# Patient Record
Sex: Male | Born: 1990 | Race: White | Hispanic: No | Marital: Single | Smoking: Current every day smoker
Health system: Southern US, Community
[De-identification: ages and names within clinical notes are randomized; demographics above are authoritative.]

## PROBLEM LIST (undated history)

## (undated) DIAGNOSIS — S62609A Fracture of unspecified phalanx of unspecified finger, initial encounter for closed fracture: Secondary | ICD-10-CM

## (undated) HISTORY — PX: APPENDECTOMY: SHX54

---
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
## 2011-08-04 ENCOUNTER — Emergency Department: Payer: Self-pay | Admitting: Emergency Medicine

## 2013-11-20 ENCOUNTER — Encounter (HOSPITAL_COMMUNITY): Payer: Self-pay | Admitting: Emergency Medicine

## 2013-11-20 ENCOUNTER — Emergency Department (HOSPITAL_COMMUNITY)
Admission: EM | Admit: 2013-11-20 | Discharge: 2013-11-20 | Disposition: A | Discharge status: Home / Self Care | Payer: Self-pay | Attending: Emergency Medicine | Admitting: Emergency Medicine

## 2013-11-20 DIAGNOSIS — L509 Urticaria, unspecified: Secondary | ICD-10-CM | POA: Insufficient documentation

## 2013-11-20 DIAGNOSIS — F17200 Nicotine dependence, unspecified, uncomplicated: Secondary | ICD-10-CM | POA: Insufficient documentation

## 2013-11-20 MED ORDER — PREDNISONE 50 MG PO TABS
60.0000 mg | ORAL_TABLET | Freq: Once | ORAL | Status: AC
  Administered 2013-11-20: 22:00:00 60 mg via ORAL
  Filled 2013-11-20 (×2): qty 1

## 2013-11-20 MED ORDER — FAMOTIDINE 20 MG PO TABS
20.0000 mg | ORAL_TABLET | Freq: Once | ORAL | Status: AC
  Administered 2013-11-20: 20 mg via ORAL
  Filled 2013-11-20: qty 1

## 2013-11-20 MED ORDER — PREDNISONE 10 MG PO TABS: ORAL_TABLET | ORAL | Status: DC

## 2013-11-20 MED ORDER — FAMOTIDINE 20 MG PO TABS: 20.0000 mg | ORAL_TABLET | Freq: Two times a day (BID) | ORAL | Status: DC

## 2013-11-20 NOTE — ED Notes (Signed)
Since Nov. 23rd pt with break out in rash/hives every night, takes benadryl which helps, took allergy relief tablet tonight

## 2013-11-20 NOTE — ED Notes (Signed)
Pt seen by EDPa for initial assessment. 

## 2013-11-22 NOTE — ED Provider Notes (Signed)
CSN: [id number]     Arrival date & time 11/20/13  2039 History   First MD Initiated Contact with Patient 11/20/13 2109     Chief Complaint  Patient presents with  . Rash   (Consider location/radiation/quality/duration/timing/severity/associated sxs/prior Treatment) Patient is a 22 y.o. male presenting with rash. The history is provided by the patient.  Rash Location:  Torso Torso rash location:  L chest, R chest and upper back Quality: itchiness and redness   Quality: not blistering, not bruising, not dry, not painful, not peeling, not scaling, not swelling and not weeping   Severity:  Mild Onset quality:  Gradual Duration:  1 month Timing:  Intermittent Progression:  Unchanged Chronicity:  Recurrent Context comment:  Began when hunting season started, has been using a "scent remover" on his clothing Relieved by:  Antihistamines Worsened by:  Nothing tried Ineffective treatments:  Antihistamines Associated symptoms: no abdominal pain, no diarrhea, no fatigue, no fever, no headaches, no hoarse voice, no induration, no myalgias, no nausea, no periorbital edema, no shortness of breath, no sore throat, no throat swelling, no tongue swelling, not vomiting and not wheezing     History reviewed. No pertinent past medical history. History reviewed. No pertinent past surgical history. History reviewed. No pertinent family history. History  Substance Use Topics  . Smoking status: Current Every Day Smoker    Types: Cigarettes  . Smokeless tobacco: Not on file  . Alcohol Use: No    Review of Systems  Constitutional: Negative for fever, chills, activity change, appetite change and fatigue.  HENT: Negative for facial swelling, hoarse voice, sore throat and trouble swallowing.   Respiratory: Negative for chest tightness, shortness of breath and wheezing.   Gastrointestinal: Negative for nausea, vomiting, abdominal pain and diarrhea.  Musculoskeletal: Negative for myalgias, neck pain and  neck stiffness.  Skin: Positive for rash. Negative for wound.  Neurological: Negative for dizziness, weakness, numbness and headaches.  All other systems reviewed and are negative.    Allergies  Review of patient's allergies indicates no known allergies.  Home Medications   Current Outpatient Rx  Name  Route  Sig  Dispense  Refill  . famotidine (PEPCID) 20 MG tablet   Oral   Take 1 tablet (20 mg total) by mouth 2 (two) times daily. For 7 days   14 tablet   0   . predniSONE (DELTASONE) 10 MG tablet      Take 6 tablets day one, 5 tablets day two, 4 tablets day three, 3 tablets day four, 2 tablets day five, then 1 tablet day six   21 tablet   0    BP 117/61  Pulse 77  Temp(Src) 98.1 F (36.7 C) (Oral)  Resp 18  Ht 5\' 10"  (1.778 m)  Wt 140 lb (63.504 kg)  BMI 20.09 kg/m2  SpO2 96% Physical Exam  Nursing note and vitals reviewed. Constitutional: He is oriented to person, place, and time. He appears well-developed and well-nourished. No distress.  HENT:  Head: Normocephalic and atraumatic.  Mouth/Throat: Oropharynx is clear and moist.  Neck: Normal range of motion, full passive range of motion without pain and phonation normal. Neck supple.  Cardiovascular: Normal rate, regular rhythm, normal heart sounds and intact distal pulses.   No murmur heard. Pulmonary/Chest: Effort normal and breath sounds normal. No respiratory distress. He has no wheezes. He has no rales. He exhibits no tenderness.  Musculoskeletal: He exhibits no edema and no tenderness.  Lymphadenopathy:    He has no cervical  adenopathy.  Neurological: He is alert and oriented to person, place, and time. He exhibits normal muscle tone. Coordination normal.  Skin: Skin is warm. Rash noted. There is erythema.  Slightly maculopapular rash to the trunk.  No edema.  No pustules or vesicles    ED Course  Procedures (including critical care time) Labs Review Labs Reviewed - No data to display Imaging  Review No results found.  EKG Interpretation   None       MDM   1. Urticaria    Patient is well appearing.  No angioedema.  Scattered intermittent hives to the torso w/o edema.  Pt agrees to prednisone taper, Pepcid and to continue benadryl.  Appears stable for d/c    Denika Krone L. Trisha Mangle, PA-C 11/22/13 1215

## 2013-11-22 NOTE — ED Provider Notes (Signed)
Medical screening examination/treatment/procedure(s) were performed by non-physician practitioner and as supervising physician I was immediately available for consultation/collaboration.  EKG Interpretation   None         Markez L Tanaya Dunigan, MD 11/22/13 2317 

## 2015-04-15 ENCOUNTER — Emergency Department (HOSPITAL_COMMUNITY)
Admission: EM | Admit: 2015-04-15 | Discharge: 2015-04-15 | Disposition: A | Discharge status: Home / Self Care | Payer: Self-pay | Attending: Emergency Medicine | Admitting: Emergency Medicine

## 2015-04-15 ENCOUNTER — Encounter (HOSPITAL_COMMUNITY): Payer: Self-pay | Admitting: Cardiology

## 2015-04-15 DIAGNOSIS — Z720 Tobacco use: Secondary | ICD-10-CM | POA: Insufficient documentation

## 2015-04-15 DIAGNOSIS — L559 Sunburn, unspecified: Secondary | ICD-10-CM

## 2015-04-15 DIAGNOSIS — L551 Sunburn of second degree: Secondary | ICD-10-CM | POA: Insufficient documentation

## 2015-04-15 DIAGNOSIS — Z79899 Other long term (current) drug therapy: Secondary | ICD-10-CM | POA: Insufficient documentation

## 2015-04-15 MED ORDER — HYDROCODONE-ACETAMINOPHEN 5-325 MG PO TABS
1.0000 | ORAL_TABLET | Freq: Once | ORAL | Status: AC
  Administered 2015-04-15: 1 via ORAL
  Filled 2015-04-15: qty 1

## 2015-04-15 MED ORDER — IBUPROFEN 800 MG PO TABS: 800.0000 mg | ORAL_TABLET | Freq: Three times a day (TID) | ORAL | Status: DC

## 2015-04-15 MED ORDER — IBUPROFEN 800 MG PO TABS
800.0000 mg | ORAL_TABLET | Freq: Once | ORAL | Status: AC
  Administered 2015-04-15: 800 mg via ORAL
  Filled 2015-04-15: qty 1

## 2015-04-15 MED ORDER — SILVER SULFADIAZINE 1 % EX CREA: TOPICAL_CREAM | CUTANEOUS | Status: DC

## 2015-04-15 MED ORDER — HYDROCODONE-ACETAMINOPHEN 5-325 MG PO TABS: ORAL_TABLET | ORAL | Status: DC

## 2015-04-15 MED ORDER — SILVER SULFADIAZINE 1 % EX CREA
TOPICAL_CREAM | Freq: Once | CUTANEOUS | Status: AC
  Administered 2015-04-15: 21:00:00 via TOPICAL
  Filled 2015-04-15: qty 50

## 2015-04-15 NOTE — ED Notes (Signed)
Sunburn to bilateral feet.

## 2015-04-15 NOTE — Discharge Instructions (Signed)
Sunburn   Sunburn is skin damage from being out in the sun too long. If you have light or fair skin, you may get sunburned more easily. Getting sunburned over and over can cause wrinkles and dark spots on the skin (sun spots). It can also increase your chance of getting skin cancer.  HOME CARE  · Avoid being out in the sun until your sunburn is gone.  · Take a cool bath to help lessen pain. Put a cold, damp washcloth on the sunburn to help lessen pain. Do not put ice on the sunburn.  · Only take medicine as told by your doctor.  · Use sunburn creams or gels on your skin but not on blisters.  · Drink enough fluids to keep your pee (urine) clear or pale yellow.  · Do not break blisters. If blisters break, your doctor may tell you to use a medicated cream on the area.  To keep from getting sunburned:  · Avoid the sun between 10:00 a.m. and 4:00 p.m. during the day.  · Put sunscreen on 30 minutes before being in the sun.  · Wear a hat, clothing, and sunglasses to protect against the sun.  · Avoid medicines, herbs, and foods that make you more sensitive to sun.  · Avoid tanning beds.  GET HELP RIGHT AWAY IF:  · You have a fever.  · You have pain and medicine does not help.  · You throw up (vomit) or have watery poop (diarrhea).  · You feel like you will pass out (faint).  · You have a headache and feel confused.  · You have very bad blisters.  · You have yellowish-white fluid (pus) coming from your blisters.  · Your burn gets more painful and puffy (swollen).  MAKE SURE YOU:  · Understand these instructions.  · Will watch your condition.  · Will get help right away if you are not doing well or get worse.  Document Released: 08/05/2011 Document Revised: 03/20/2013 Document Reviewed: 08/05/2011  [employer]® Patient Information ©2015 [employer]. This information is not intended to replace advice given to you by your health care provider. Make sure you discuss any questions you have with your health care provider.

## 2015-04-15 NOTE — ED Notes (Signed)
Both feet red with blister on rt foot. [place] in  [place]
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
## 2018-12-07 DIAGNOSIS — S62609A Fracture of unspecified phalanx of unspecified finger, initial encounter for closed fracture: Secondary | ICD-10-CM

## 2018-12-07 HISTORY — DX: Fracture of unspecified phalanx of unspecified finger, initial encounter for closed fracture: S62.609A

## 2018-12-08 ENCOUNTER — Encounter (HOSPITAL_BASED_OUTPATIENT_CLINIC_OR_DEPARTMENT_OTHER): Payer: Self-pay | Admitting: *Deleted

## 2018-12-08 ENCOUNTER — Encounter (HOSPITAL_COMMUNITY): Payer: Self-pay | Admitting: Emergency Medicine

## 2018-12-08 ENCOUNTER — Other Ambulatory Visit: Payer: Self-pay | Admitting: Orthopedic Surgery

## 2018-12-08 ENCOUNTER — Other Ambulatory Visit: Payer: Self-pay

## 2018-12-08 ENCOUNTER — Emergency Department (HOSPITAL_COMMUNITY)
Admission: EM | Admit: 2018-12-08 | Discharge: 2018-12-08 | Disposition: A | Discharge status: Home / Self Care | Payer: Self-pay | Attending: Emergency Medicine | Admitting: Emergency Medicine

## 2018-12-08 ENCOUNTER — Emergency Department (HOSPITAL_COMMUNITY): Payer: Self-pay

## 2018-12-08 DIAGNOSIS — W010XXA Fall on same level from slipping, tripping and stumbling without subsequent striking against object, initial encounter: Secondary | ICD-10-CM | POA: Insufficient documentation

## 2018-12-08 DIAGNOSIS — F17210 Nicotine dependence, cigarettes, uncomplicated: Secondary | ICD-10-CM | POA: Insufficient documentation

## 2018-12-08 DIAGNOSIS — S62619A Displaced fracture of proximal phalanx of unspecified finger, initial encounter for closed fracture: Secondary | ICD-10-CM

## 2018-12-08 DIAGNOSIS — Y929 Unspecified place or not applicable: Secondary | ICD-10-CM | POA: Insufficient documentation

## 2018-12-08 DIAGNOSIS — S62617A Displaced fracture of proximal phalanx of left little finger, initial encounter for closed fracture: Secondary | ICD-10-CM | POA: Insufficient documentation

## 2018-12-08 DIAGNOSIS — Y9389 Activity, other specified: Secondary | ICD-10-CM | POA: Insufficient documentation

## 2018-12-08 DIAGNOSIS — Y999 Unspecified external cause status: Secondary | ICD-10-CM | POA: Insufficient documentation

## 2018-12-08 MED ORDER — HYDROCODONE-ACETAMINOPHEN 5-325 MG PO TABS
2.0000 | ORAL_TABLET | Freq: Once | ORAL | Status: AC
  Administered 2018-12-08: 2 via ORAL
  Filled 2018-12-08: qty 2

## 2018-12-08 MED ORDER — HYDROCODONE-ACETAMINOPHEN 5-325 MG PO TABS: 1.0000 | ORAL_TABLET | Freq: Four times a day (QID) | ORAL | 0 refills | Status: DC | PRN

## 2018-12-08 NOTE — ED Triage Notes (Signed)
Pt states he fell and tried to catch himself when he injured his left pinky finger.

## 2018-12-08 NOTE — Discharge Instructions (Addendum)
Wear ulnar gutter splint until followed up by orthopedics.  Hydrocodone is prescribed as needed for pain.  Ice for 20 minutes every 2 hours while awake for the next 2 days.  You are to follow-up with Dr. Janee Morn in the next few days.  He is aware of your condition and will have his office contact you to make these arrangements.

## 2018-12-08 NOTE — ED Provider Notes (Addendum)
[facility] EMERGENCY DEPARTMENT Provider Note   CSN: [id number] Arrival date & time: 12/08/18  0152     History   Chief Complaint Chief Complaint  Patient presents with  . Finger Injury    HPI Darrell Cannon is a 28 y.o. male.  Patient is a 28 year old male presenting with complaints of a left fifth finger injury.  He reports falling and trying to catch himself with his hand.  This happened earlier this evening.  He reports pain and swelling since.  Pain is worse with movement.  There are no alleviating factors.  The history is provided by the patient.    History reviewed. No pertinent past medical history.  There are no active problems to display for this patient.   Past Surgical History:  Procedure Laterality Date  . APPENDECTOMY          Home Medications    Prior to Admission medications   Medication Sig Start Date End Date Taking? Authorizing Provider  famotidine (PEPCID) 20 MG tablet Take 1 tablet (20 mg total) by mouth 2 (two) times daily. For 7 days 11/20/13   Pauline Aus, PA-C  HYDROcodone-acetaminophen (NORCO/VICODIN) 5-325 MG per tablet Take one-two tabs po q 4-6 hrs prn pain 04/15/15   Triplett, Tammy, PA-C  ibuprofen (ADVIL,MOTRIN) 800 MG tablet Take 1 tablet (800 mg total) by mouth 3 (three) times daily. 04/15/15   Triplett, Tammy, PA-C  predniSONE (DELTASONE) 10 MG tablet Take 6 tablets day one, 5 tablets day two, 4 tablets day three, 3 tablets day four, 2 tablets day five, then 1 tablet day six 11/20/13   Triplett, Tammy, PA-C  silver sulfADIAZINE (SILVADENE) 1 % cream Wash off and re-apply twice a day 04/15/15   Pauline Aus, PA-C    Family History No family history on file.  Social History Social History   Tobacco Use  . Smoking status: Current Every Day Smoker    Types: Cigarettes  . Smokeless tobacco: Never Used  Substance Use Topics  . Alcohol use: No  . Drug use: No     Allergies   Patient has no known allergies.   Review of  Systems Review of Systems  All other systems reviewed and are negative.    Physical Exam Updated Vital Signs BP 123/77 (BP Location: Left Arm)   Pulse 70   Temp 98.1 F (36.7 C) (Oral)   Resp 18   SpO2 97%   Physical Exam Vitals signs and nursing note reviewed.  Constitutional:      Appearance: Normal appearance.  Pulmonary:     Effort: Pulmonary effort is normal.  Musculoskeletal:     Comments: The left fifth finger is noted to be swollen and tender over the proximal phalanx.  There does appear to be some internal rotation of the finger.  Capillary refill is brisk and sensation is intact to the fingertip.  Skin:    General: Skin is warm and dry.  Neurological:     Mental Status: He is alert.      ED Treatments / Results  Labs (all labs ordered are listed, but only abnormal results are displayed) Labs Reviewed - No data to display  EKG None  Radiology Dg Finger Little Left  Result Date: 12/08/2018 CLINICAL DATA:  Fall with little finger injury EXAM: LEFT LITTLE FINGER 2+V COMPARISON:  None. FINDINGS: There is an oblique, extra-articular, mildly dorsally and laterally displaced fracture of the proximal phalanx of the left little finger. Moderate soft tissue swelling. IMPRESSION: Oblique,  extra-articular, mildly dorsally and laterally displaced fracture of the proximal phalanx of the left little finger. Electronically Signed   By: Deatra Robinson M.D.   On: 12/08/2018 02:23    Procedures Procedures (including critical care time)  Medications Ordered in ED Medications - No data to display   Initial Impression / Assessment and Plan / ED Course  I have reviewed the triage vital signs and the nursing notes.  Pertinent labs & imaging results that were available during my care of the patient were reviewed by me and considered in my medical decision making (see chart for details).  X-rays show a spiral/oblique fracture of the proximal phalanx of the left fifth finger.   This finding was discussed with Dr. Janee Morn from hand surgery.  He would like the patient to be placed in an ulnar gutter splint.  His office will call to make arrangements for follow-up in the upcoming 1 to 2 days.  Patient will also be prescribed medication for pain.  [facility] database was reviewed and the patient has no previous controlled substances filled.  Final Clinical Impressions(s) / ED Diagnoses   Final diagnoses:  None    ED Discharge Orders    None       Geoffery Lyons, MD 12/08/18 6767    Geoffery Lyons, MD 12/08/18 0320

## 2018-12-09 NOTE — H&P (Signed)
Darrell Cannon is an 28 y.o. male.   Chief Complaint: L SF injury HPI: this patient is a 28 year old male, who reports falling, sustaining an injury to the left small finger.  He was evaluated in emergency department at [facility].  He was noted to have slight digital malrotation, x-rays consistent with a displaced fracture of the proximal phalanx of the left small finger, and he was splinted.  Past Medical History:  Diagnosis Date  . Finger fracture, left 12/07/2018   small finger    Past Surgical History:  Procedure Laterality Date  . APPENDECTOMY      History reviewed. No pertinent family history. Social History:  reports that he has been smoking cigarettes. He has a 5.00 pack-year smoking history. He has never used smokeless tobacco. He reports current alcohol use. He reports that he does not use drugs.  Allergies: No Known Allergies  No medications prior to admission.    No results found for this or any previous visit (from the past 48 hour(s)). Dg Finger Little Left  Result Date: 12/08/2018 CLINICAL DATA:  Fall with little finger injury EXAM: LEFT LITTLE FINGER 2+V COMPARISON:  None. FINDINGS: There is an oblique, extra-articular, mildly dorsally and laterally displaced fracture of the proximal phalanx of the left little finger. Moderate soft tissue swelling. IMPRESSION: Oblique, extra-articular, mildly dorsally and laterally displaced fracture of the proximal phalanx of the left little finger. Electronically Signed   By: Deatra Robinson M.D.   On: 12/08/2018 02:23    Review of Systems  All other systems reviewed and are negative.   Height 5\' 4"  (1.626 m), weight 58.1 kg. Physical Exam  Constitutional: He is oriented to person, place, and time. He appears well-developed and well-nourished.  HENT:  Head: Normocephalic and atraumatic.  Eyes: EOM are normal.  Neck: Normal range of motion.  Cardiovascular: Intact distal pulses.  Respiratory: Effort normal.  GI: Soft.    Musculoskeletal:       Arms:  Neurological: He is alert and oriented to person, place, and time.  Skin: Skin is warm and dry.  Psychiatric: He has a normal mood and affect. His behavior is normal. Judgment and thought content normal.     Assessment/Plan I discussed these findings with him and options for treatment.  I confirmed her previous recommendation to proceed with operative intervention, likely with plate and screw fixation to obtain and maintain a more anatomic alignment, optimizing function and appropriate alignment of the digit, particular when it is closed.  The details of the operative procedure were discussed with the patient.  Questions were invited and answered.  In addition to the goal of the procedure, the risks of the procedure to include but not limited to bleeding; infection; damage to the nerves or blood vessels that could result in bleeding, numbness, weakness, chronic pain, and the need for additional procedures; stiffness; the need for revision surgery; and anesthetic risks were reviewed.  No specific outcome was guaranteed or implied.  Informed consent was obtained.  Jodi Marble, MD 12/09/2018, 2:12 PM

## 2018-12-12 ENCOUNTER — Ambulatory Visit (HOSPITAL_BASED_OUTPATIENT_CLINIC_OR_DEPARTMENT_OTHER)
Admission: RE | Admit: 2018-12-12 | Discharge: 2018-12-12 | Disposition: A | Discharge status: Home / Self Care | Payer: Self-pay | Attending: Orthopedic Surgery | Admitting: Orthopedic Surgery

## 2018-12-12 ENCOUNTER — Encounter (HOSPITAL_BASED_OUTPATIENT_CLINIC_OR_DEPARTMENT_OTHER)
Admission: RE | Disposition: A | Discharge status: Home / Self Care | Payer: Self-pay | Source: Home / Self Care | Attending: Orthopedic Surgery

## 2018-12-12 ENCOUNTER — Encounter (HOSPITAL_BASED_OUTPATIENT_CLINIC_OR_DEPARTMENT_OTHER): Payer: Self-pay | Admitting: Certified Registered"

## 2018-12-12 ENCOUNTER — Ambulatory Visit (HOSPITAL_COMMUNITY): Payer: Self-pay

## 2018-12-12 ENCOUNTER — Other Ambulatory Visit: Payer: Self-pay

## 2018-12-12 ENCOUNTER — Ambulatory Visit (HOSPITAL_BASED_OUTPATIENT_CLINIC_OR_DEPARTMENT_OTHER): Payer: Self-pay | Admitting: Certified Registered"

## 2018-12-12 DIAGNOSIS — S62617A Displaced fracture of proximal phalanx of left little finger, initial encounter for closed fracture: Secondary | ICD-10-CM | POA: Insufficient documentation

## 2018-12-12 DIAGNOSIS — W19XXXA Unspecified fall, initial encounter: Secondary | ICD-10-CM | POA: Insufficient documentation

## 2018-12-12 DIAGNOSIS — Z419 Encounter for procedure for purposes other than remedying health state, unspecified: Secondary | ICD-10-CM

## 2018-12-12 DIAGNOSIS — F17210 Nicotine dependence, cigarettes, uncomplicated: Secondary | ICD-10-CM | POA: Insufficient documentation

## 2018-12-12 HISTORY — PX: OPEN REDUCTION INTERNAL FIXATION (ORIF) METACARPAL: SHX6234

## 2018-12-12 HISTORY — DX: Fracture of unspecified phalanx of unspecified finger, initial encounter for closed fracture: S62.609A

## 2018-12-12 SURGERY — OPEN REDUCTION INTERNAL FIXATION (ORIF) METACARPAL
Anesthesia: General | Site: Hand | Laterality: Left

## 2018-12-12 MED ORDER — ACETAMINOPHEN 325 MG PO TABS: 650.0000 mg | ORAL_TABLET | Freq: Four times a day (QID) | ORAL | Status: DC

## 2018-12-12 MED ORDER — MIDAZOLAM HCL 2 MG/2ML IJ SOLN
INTRAMUSCULAR | Status: AC
  Filled 2018-12-12: qty 2

## 2018-12-12 MED ORDER — CEFAZOLIN SODIUM-DEXTROSE 2-4 GM/100ML-% IV SOLN
INTRAVENOUS | Status: AC
  Filled 2018-12-12: qty 100

## 2018-12-12 MED ORDER — DEXAMETHASONE SODIUM PHOSPHATE 10 MG/ML IJ SOLN
INTRAMUSCULAR | Status: DC | PRN
  Administered 2018-12-12: 10 mg via INTRAVENOUS

## 2018-12-12 MED ORDER — MIDAZOLAM HCL 2 MG/2ML IJ SOLN
1.0000 mg | INTRAMUSCULAR | Status: DC | PRN
  Administered 2018-12-12: 2 mg via INTRAVENOUS

## 2018-12-12 MED ORDER — PHENYLEPHRINE 40 MCG/ML (10ML) SYRINGE FOR IV PUSH (FOR BLOOD PRESSURE SUPPORT)
PREFILLED_SYRINGE | INTRAVENOUS | Status: AC
  Filled 2018-12-12: qty 30

## 2018-12-12 MED ORDER — CEFAZOLIN SODIUM-DEXTROSE 2-4 GM/100ML-% IV SOLN
2.0000 g | INTRAVENOUS | Status: AC
  Administered 2018-12-12: 2 g via INTRAVENOUS

## 2018-12-12 MED ORDER — OXYCODONE HCL 5 MG PO TABS
5.0000 mg | ORAL_TABLET | Freq: Once | ORAL | Status: AC | PRN
  Administered 2018-12-12: 5 mg via ORAL

## 2018-12-12 MED ORDER — PROPOFOL 10 MG/ML IV BOLUS
INTRAVENOUS | Status: DC | PRN
  Administered 2018-12-12: 200 mg via INTRAVENOUS

## 2018-12-12 MED ORDER — DEXAMETHASONE SODIUM PHOSPHATE 10 MG/ML IJ SOLN
INTRAMUSCULAR | Status: AC
  Filled 2018-12-12: qty 1

## 2018-12-12 MED ORDER — OXYCODONE HCL 5 MG PO TABS
ORAL_TABLET | ORAL | Status: AC
  Filled 2018-12-12: qty 1

## 2018-12-12 MED ORDER — LACTATED RINGERS IV SOLN
INTRAVENOUS | Status: DC
  Administered 2018-12-12 (×2): via INTRAVENOUS

## 2018-12-12 MED ORDER — ONDANSETRON HCL 4 MG/2ML IJ SOLN
INTRAMUSCULAR | Status: DC | PRN
  Administered 2018-12-12: 4 mg via INTRAVENOUS

## 2018-12-12 MED ORDER — SCOPOLAMINE 1 MG/3DAYS TD PT72: 1.0000 | MEDICATED_PATCH | Freq: Once | TRANSDERMAL | Status: DC | PRN

## 2018-12-12 MED ORDER — OXYCODONE HCL 5 MG PO TABS: 5.0000 mg | ORAL_TABLET | Freq: Four times a day (QID) | ORAL | 0 refills | Status: DC | PRN

## 2018-12-12 MED ORDER — LIDOCAINE HCL (CARDIAC) PF 100 MG/5ML IV SOSY
PREFILLED_SYRINGE | INTRAVENOUS | Status: DC | PRN
  Administered 2018-12-12: 60 mg via INTRAVENOUS

## 2018-12-12 MED ORDER — FENTANYL CITRATE (PF) 100 MCG/2ML IJ SOLN
50.0000 ug | INTRAMUSCULAR | Status: DC | PRN
  Administered 2018-12-12: 100 ug via INTRAVENOUS

## 2018-12-12 MED ORDER — BUPIVACAINE-EPINEPHRINE (PF) 0.25% -1:200000 IJ SOLN
INTRAMUSCULAR | Status: DC | PRN
  Administered 2018-12-12: 10 mL via PERINEURAL

## 2018-12-12 MED ORDER — LACTATED RINGERS IV SOLN: INTRAVENOUS | Status: DC

## 2018-12-12 MED ORDER — FENTANYL CITRATE (PF) 100 MCG/2ML IJ SOLN: 25.0000 ug | INTRAMUSCULAR | Status: DC | PRN

## 2018-12-12 MED ORDER — EPHEDRINE 5 MG/ML INJ
INTRAVENOUS | Status: AC
  Filled 2018-12-12: qty 30

## 2018-12-12 MED ORDER — IBUPROFEN 200 MG PO TABS: 600.0000 mg | ORAL_TABLET | Freq: Four times a day (QID) | ORAL | 0 refills | Status: DC

## 2018-12-12 MED ORDER — PROMETHAZINE HCL 25 MG/ML IJ SOLN: 6.2500 mg | INTRAMUSCULAR | Status: DC | PRN

## 2018-12-12 MED ORDER — FENTANYL CITRATE (PF) 100 MCG/2ML IJ SOLN
INTRAMUSCULAR | Status: AC
  Filled 2018-12-12: qty 2

## 2018-12-12 MED ORDER — CHLORHEXIDINE GLUCONATE 4 % EX LIQD: 60.0000 mL | Freq: Once | CUTANEOUS | Status: DC

## 2018-12-12 SURGICAL SUPPLY — 66 items
BIT DRILL 1.1 (BIT) ×2
BIT DRILL 1.1MM (BIT) ×1
BIT DRILL 60X20X1.1XQC TMX (BIT) IMPLANT
BIT DRL 60X20X1.1XQC TMX (BIT) ×1
BLADE MINI RND TIP GREEN BEAV (BLADE) IMPLANT
BLADE SURG 15 STRL LF DISP TIS (BLADE) ×1 IMPLANT
BLADE SURG 15 STRL SS (BLADE) ×3
BNDG CMPR 9X4 STRL LF SNTH (GAUZE/BANDAGES/DRESSINGS) ×1
BNDG COHESIVE 4X5 TAN STRL (GAUZE/BANDAGES/DRESSINGS) ×3 IMPLANT
BNDG ESMARK 4X9 LF (GAUZE/BANDAGES/DRESSINGS) ×3 IMPLANT
BNDG GAUZE ELAST 4 BULKY (GAUZE/BANDAGES/DRESSINGS) ×3 IMPLANT
CANISTER SUCTION 1200CC (MISCELLANEOUS) ×2 IMPLANT
CHLORAPREP W/TINT 26ML (MISCELLANEOUS) ×3 IMPLANT
CORD BIPOLAR FORCEPS 12FT (ELECTRODE) ×3 IMPLANT
COVER BACK TABLE 60X90IN (DRAPES) ×3 IMPLANT
COVER MAYO STAND STRL (DRAPES) ×3 IMPLANT
COVER WAND RF STERILE (DRAPES) IMPLANT
CUFF TOURNIQUET SINGLE 18IN (TOURNIQUET CUFF) ×2 IMPLANT
DRAPE C-ARM 42X72 X-RAY (DRAPES) ×3 IMPLANT
DRAPE EXTREMITY T 121X128X90 (DISPOSABLE) ×3 IMPLANT
DRAPE SURG 17X23 STRL (DRAPES) ×3 IMPLANT
DRSG EMULSION OIL 3X3 NADH (GAUZE/BANDAGES/DRESSINGS) ×3 IMPLANT
GAUZE SPONGE 4X4 12PLY STRL LF (GAUZE/BANDAGES/DRESSINGS) ×3 IMPLANT
GLOVE BIO SURGEON STRL SZ7.5 (GLOVE) ×3 IMPLANT
GLOVE BIOGEL PI IND STRL 7.0 (GLOVE) ×1 IMPLANT
GLOVE BIOGEL PI IND STRL 8 (GLOVE) ×1 IMPLANT
GLOVE BIOGEL PI INDICATOR 7.0 (GLOVE) ×2
GLOVE BIOGEL PI INDICATOR 8 (GLOVE) ×6
GLOVE ECLIPSE 6.5 STRL STRAW (GLOVE) ×3 IMPLANT
GLOVE SURG SYN 8.0 (GLOVE) ×3 IMPLANT
GLOVE SURG SYN 8.0 PF PI (GLOVE) IMPLANT
GOWN STRL REIN XL XLG (GOWN DISPOSABLE) ×2 IMPLANT
GOWN STRL REUS W/ TWL LRG LVL3 (GOWN DISPOSABLE) ×2 IMPLANT
GOWN STRL REUS W/TWL LRG LVL3 (GOWN DISPOSABLE) ×6
GOWN STRL REUS W/TWL XL LVL3 (GOWN DISPOSABLE) ×3 IMPLANT
LOCK SCREW 1.5X9MM (Screw) ×12 IMPLANT
NDL HYPO 25X1 1.5 SAFETY (NEEDLE) IMPLANT
NEEDLE HYPO 25X1 1.5 SAFETY (NEEDLE) ×3 IMPLANT
NS IRRIG 1000ML POUR BTL (IV SOLUTION) ×3 IMPLANT
PACK BASIN DAY SURGERY FS (CUSTOM PROCEDURE TRAY) ×3 IMPLANT
PADDING CAST ABS 4INX4YD NS (CAST SUPPLIES)
PADDING CAST ABS COTTON 4X4 ST (CAST SUPPLIES) IMPLANT
PLATE T SMALL 1.5MM (Plate) ×2 IMPLANT
SCREW L 1.5X12 (Screw) ×2 IMPLANT
SCREW LOCK 1.5X9MM (Screw) IMPLANT
SCREW LOCKING 1.5X10 (Screw) ×2 IMPLANT
SCREW LOCKING 1.5X11MM (Screw) ×2 IMPLANT
SCREW LOCKING 1.5X13MM (Screw) ×2 IMPLANT
SLEEVE SCD COMPRESS KNEE MED (MISCELLANEOUS) ×3 IMPLANT
SLING ARM FOAM STRAP LRG (SOFTGOODS) ×2 IMPLANT
SPLINT PLASTER CAST XFAST 3X15 (CAST SUPPLIES) ×1 IMPLANT
SPLINT PLASTER XTRA FASTSET 3X (CAST SUPPLIES) ×2
STOCKINETTE 6  STRL (DRAPES) ×2
STOCKINETTE 6 STRL (DRAPES) ×1 IMPLANT
SUCTION FRAZIER HANDLE 10FR (MISCELLANEOUS) ×2
SUCTION TUBE FRAZIER 10FR DISP (MISCELLANEOUS) IMPLANT
SUT VICRYL 3-0 RB1 (SUTURE) ×2 IMPLANT
SUT VICRYL RAPIDE 4-0 (SUTURE) IMPLANT
SUT VICRYL RAPIDE 4/0 PS 2 (SUTURE) ×2 IMPLANT
SYR 10ML LL (SYRINGE) ×2 IMPLANT
SYR BULB 3OZ (MISCELLANEOUS) ×2 IMPLANT
TOWEL GREEN STERILE FF (TOWEL DISPOSABLE) ×3 IMPLANT
TOWEL OR NON WOVEN STRL DISP B (DISPOSABLE) IMPLANT
TUBE CONNECTING 20'X1/4 (TUBING) ×1
TUBE CONNECTING 20X1/4 (TUBING) ×1 IMPLANT
UNDERPAD 30X30 (UNDERPADS AND DIAPERS) ×3 IMPLANT

## 2018-12-12 NOTE — Transfer of Care (Signed)
Immediate Anesthesia Transfer of Care Note  Patient: Darrell Cannon  Procedure(s) Performed: OPEN REDUCTION INTERNAL FIXATION (ORIF) LEFT SMALL FINGER FRACTURE (Left Hand)  Patient Location: PACU  Anesthesia Type:General  Level of Consciousness: sedated and patient cooperative  Airway & Oxygen Therapy: Patient Spontanous Breathing and Patient connected to face mask oxygen  Post-op Assessment: Report given to RN and Post -op Vital signs reviewed and stable  Post vital signs: Reviewed and stable  Last Vitals:  Vitals Value Taken Time  BP    Temp    Pulse 63 12/12/2018  1:22 PM  Resp    SpO2 99 % 12/12/2018  1:22 PM  Vitals shown include unvalidated device data.  Last Pain:  Vitals:   12/12/18 1032  TempSrc: Oral  PainSc: 0-No pain         Complications: No apparent anesthesia complications

## 2018-12-12 NOTE — Anesthesia Postprocedure Evaluation (Signed)
Anesthesia Post Note  Patient: [name]  Procedure(s) Performed: OPEN REDUCTION INTERNAL FIXATION (ORIF) LEFT SMALL FINGER FRACTURE (Left Hand)     Patient location during evaluation: PACU Anesthesia Type: General Level of consciousness: awake and alert Pain management: pain level controlled Vital Signs Assessment: post-procedure vital signs reviewed and stable Respiratory status: spontaneous breathing, nonlabored ventilation, respiratory function stable and patient connected to nasal cannula oxygen Cardiovascular status: blood pressure returned to baseline and stable Postop Assessment: no apparent nausea or vomiting Anesthetic complications: no    Last Vitals:  Vitals:   12/12/18 1345 12/12/18 1400  BP: (!) 99/51 (!) 96/58  Pulse: (!) 57 (!) 54  Resp: 10 11  Temp:    SpO2: 100% 100%    Last Pain:  Vitals:   12/12/18 1400  TempSrc:   PainSc: 3                  Cecile Hearing

## 2018-12-12 NOTE — Interval H&P Note (Signed)
History and Physical Interval Note:  12/12/2018 11:55 AM  Darrell Cannon  has presented today for surgery, with the diagnosis of LEFT SMALL FINGER FRACTURE  The various methods of treatment have been discussed with the patient and family. After consideration of risks, benefits and other options for treatment, the patient has consented to  Procedure(s): OPEN REDUCTION INTERNAL FIXATION (ORIF) LEFT SMALL FINGER FRACTURE (Left) as a surgical intervention .  The patient's history has been reviewed, patient examined, no change in status, stable for surgery.  I have reviewed the patient's chart and labs.  Questions were answered to the patient's satisfaction.     Jodi Marble

## 2018-12-12 NOTE — Anesthesia Procedure Notes (Signed)
Procedure Name: LMA Insertion Date/Time: 12/12/2018 12:26 PM Performed by: Sheryn Bison, CRNA Pre-anesthesia Checklist: Patient identified, Emergency Drugs available, Suction available and Patient being monitored Patient Re-evaluated:Patient Re-evaluated prior to induction Oxygen Delivery Method: Circle system utilized Preoxygenation: Pre-oxygenation with 100% oxygen Induction Type: IV induction Ventilation: Mask ventilation without difficulty LMA: LMA inserted LMA Size: 4.0 Number of attempts: 1 Airway Equipment and Method: Bite block Placement Confirmation: positive ETCO2 Tube secured with: Tape Dental Injury: Teeth and Oropharynx as per pre-operative assessment

## 2018-12-12 NOTE — Discharge Instructions (Signed)
Discharge Instructions   You have a dressing with a plaster splint incorporated in it. Move your fingers as much as possible, making a full fist and fully opening the fist. Elevate your hand to reduce pain & swelling of the digits.  Ice over the operative site may be helpful to reduce pain & swelling.  DO NOT USE HEAT. Pain medicine has been prescribed for you.  Tylenol 650 mg and Ibuprofen 600 mg are to be taken together every 6 hours. Take the oxycodone additionally for severe post operative pain as a rescue medicine. Leave the dressing in place until you return to our office.  You may shower, but keep the bandage clean & dry.  You may drive a car when you are off of prescription pain medications and can safely control your vehicle with both hands. Our office will call you to arrange follow-up   Please call [phone] during normal business hours or [phone] after hours for any problems. Including the following:  - excessive redness of the incisions - drainage for more than 4 days - fever of more than 101.5 F  *Please note that pain medications will not be refilled after hours or on weekends.  WORK STATUS: No lifting gripping or grasping greater than pencil and paper tasks with the left hand until he returns for his first post operative evaluation.   Post Anesthesia Home Care Instructions  Activity: Get plenty of rest for the remainder of the day. A responsible individual must stay with you for 24 hours following the procedure.  For the next 24 hours, DO NOT: -Drive a car -Advertising copywriter -Drink alcoholic beverages -Take any medication unless instructed by your physician -Make any legal decisions or sign important papers.  Meals: Start with liquid foods such as gelatin or soup. Progress to regular foods as tolerated. Avoid greasy, spicy, heavy foods. If nausea and/or vomiting occur, drink only clear liquids until the nausea and/or vomiting subsides. Call your physician  if vomiting continues.  Special Instructions/Symptoms: Your throat may feel dry or sore from the anesthesia or the breathing tube placed in your throat during surgery. If this causes discomfort, gargle with warm salt water. The discomfort should disappear within 24 hours.  If you had a scopolamine patch placed behind your ear for the management of post- operative nausea and/or vomiting:  1. The medication in the patch is effective for 72 hours, after which it should be removed.  Wrap patch in a tissue and discard in the trash. Wash hands thoroughly with soap and water. 2. You may remove the patch earlier than 72 hours if you experience unpleasant side effects which may include dry mouth, dizziness or visual disturbances. 3. Avoid touching the patch. Wash your hands with soap and water after contact with the patch.

## 2018-12-12 NOTE — Op Note (Signed)
12/12/2018  11:56 AM  PATIENT:  Darrell Cannon  28 y.o. male  PRE-OPERATIVE DIAGNOSIS:  Displaced left small finger proximal phalanx fracture  POST-OPERATIVE DIAGNOSIS:  Same  PROCEDURE:  ORIF L SF P1 fx  SURGEON: Cliffton Asters. Janee Morn, MD  PHYSICIAN ASSISTANT: Danielle Rankin, OPA-C  ANESTHESIA:  general  SPECIMENS:  None  DRAINS:   None  EBL:  less than 50 mL  PREOPERATIVE INDICATIONS:  Darrell Cannon is a  28 y.o. male with displaced closed left small finger P1 fracture.  The risks benefits and alternatives were discussed with the patient preoperatively including but not limited to the risks of infection, bleeding, nerve injury, cardiopulmonary complications, the need for revision surgery, among others, and the patient verbalized understanding and consented to proceed.  OPERATIVE IMPLANTS: Biomet ALPS hand set, 1.83mm plate/screws  OPERATIVE PROCEDURE:  After receiving prophylactic antibiotics, the patient was escorted to the operative theatre and placed in a supine position.  General anesthesia was administered.  A surgical "time-out" was performed during which the planned procedure, proposed operative site, and the correct patient identity were compared to the operative consent and agreement confirmed by the circulating nurse according to current facility policy.  Following application of a tourniquet to the operative extremity, the exposed skin was pre-scrubbed with a Hibiclens scrub brush before being formally prepped with Chloraprep and draped in the usual sterile fashion.  The limb was exsanguinated with an Esmarch bandage and the tourniquet inflated to approximately higher than systolic BP.  A dorsal ulnar longitudinal incision was made sharply with a scalpel, subcutaneous tissues dissected with blunt and spreading dissection.  The extensor tendon was split down the middle and reflected radially and ulnarly, and the periosteum was divided slightly ulnar to the midline  and reflected.  Debris was removed from the oblique/spiral fracture.  It was provisionally reduced and held with a clamp.  Clinical alignment was good.  A small T plate from the 1.5 mm module was then applied, securing the near-anatomic alignment.  This was done with fluoroscopic guidance.  Final images were obtained.  The wound was then irrigated and the periosteum reapproximated with 3-0 Vicryl suture, followed by reapproximation of the extensor tendon with the same type running suture.  The wound was again irrigated, tourniquet released, and Marcaine with epinephrine instilled at the base of the digit to help with postoperative pain control.  The skin was reapproximated with a running 4-0 Vicryl Rapide horizontal mattress suture in a short arm splint dressing with an ulnar gutter component was applied.  He was taken to the recovery room in stable condition, breathing spontaneously  DISPOSITION: He will be discharged home today with typical instructions, returning to therapy in 3 to 7 days to have a custom ulnar gutter hand-based splint fabricated and initiation of active and passive range of motion exercises.  When he returns to our office in 10 to 15 days, he should have new x-rays of the left small finger out of splint.

## 2018-12-12 NOTE — Anesthesia Preprocedure Evaluation (Addendum)
Anesthesia Evaluation  Patient identified by MRN, date of birth, ID band Patient awake    Reviewed: Allergy & Precautions, NPO status , Patient's Chart, lab work & pertinent test results  Airway Mallampati: II  TM Distance: >3 FB Neck ROM: Full    Dental  (+) Teeth Intact, Dental Advisory Given   Pulmonary Current Smoker,    Pulmonary exam normal breath sounds clear to auscultation       Cardiovascular Exercise Tolerance: Good negative cardio ROS Normal cardiovascular exam Rhythm:Regular Rate:Normal     Neuro/Psych negative neurological ROS  negative psych ROS   GI/Hepatic negative GI ROS, Neg liver ROS,   Endo/Other  negative endocrine ROS  Renal/GU negative Renal ROS     Musculoskeletal LEFT SMALL FINGER FRACTURE   Abdominal   Peds  Hematology   Anesthesia Other Findings Day of surgery medications reviewed with the patient.  Reproductive/Obstetrics                             Anesthesia Physical Anesthesia Plan  ASA: II  Anesthesia Plan: General   Post-op Pain Management:    Induction: Intravenous  PONV Risk Score and Plan: 2 and Midazolam, Dexamethasone and Ondansetron  Airway Management Planned: LMA  Additional Equipment:   Intra-op Plan:   Post-operative Plan: Extubation in OR  Informed Consent: I have reviewed the patients History and Physical, chart, labs and discussed the procedure including the risks, benefits and alternatives for the proposed anesthesia with the patient or authorized representative who has indicated his/her understanding and acceptance.   Dental advisory given  Plan Discussed with: CRNA  Anesthesia Plan Comments:         Anesthesia Quick Evaluation

## 2018-12-14 ENCOUNTER — Encounter (HOSPITAL_BASED_OUTPATIENT_CLINIC_OR_DEPARTMENT_OTHER): Payer: Self-pay | Admitting: Orthopedic Surgery

## 2018-12-19 ENCOUNTER — Ambulatory Visit: Payer: Self-pay | Admitting: *Deleted

## 2018-12-20 ENCOUNTER — Ambulatory Visit: Payer: Self-pay | Attending: Internal Medicine | Admitting: Occupational Therapy

## 2018-12-20 DIAGNOSIS — R600 Localized edema: Secondary | ICD-10-CM

## 2018-12-20 DIAGNOSIS — M6281 Muscle weakness (generalized): Secondary | ICD-10-CM

## 2018-12-20 DIAGNOSIS — M25642 Stiffness of left hand, not elsewhere classified: Secondary | ICD-10-CM

## 2018-12-20 DIAGNOSIS — M79642 Pain in left hand: Secondary | ICD-10-CM

## 2018-12-20 NOTE — Patient Instructions (Addendum)
WEARING SCHEDULE:  Wear splint at ALL times except for hygiene care and exercises (May remove splint for exercises and then immediately place back on ONLY if directed by the therapist)  PURPOSE:  To prevent movement and for protection until injury can heal  CARE OF SPLINT:  Keep splint away from heat sources including: stove, radiator or furnace, or a car in sunlight. The splint can melt and will no longer fit you properly  Keep away from pets and children  Clean the splint with rubbing alcohol 1-2 times per day.  * During this time, make sure you also clean your hand/arm as instructed by your therapist and/or perform dressing changes as needed. Then dry hand/arm completely before replacing splint. Wash hand with soap and water, do not submerge, dry hand thoroughly before reapplying splint  PRECAUTIONS/POTENTIAL PROBLEMS: *If you notice or experience increased pain, swelling, numbness, or a lingering reddened area from the splint: Contact your therapist immediately by calling [phone]. You must wear the splint for protection, but we will get you scheduled for adjustments as quickly as possible.  (If only straps or hooks need to be replaced and NO adjustments to the splint need to be made, just call the office ahead and let them know you are coming in)  If you have any medical concerns or signs of infection, please call your doctor immediately           Flexor Tendon Gliding (Active Hook Fist)   With fingers and knuckles straight, bend middle and tip joints. Do not bend large knuckles. Repeat _10-15___ times. Do _6-8___ sessions per day.  MP Flexion (Active)   With back of hand on table, bend large knuckles as far as they will go, keeping small joints straight. Repeat _10-15___ times. Do __6-8__ sessions per day.   PIP Extension (Active Controlled With Wrist and MP Flexion)    KEEP WRIST STRAIGHT (not like picture), Bend large knuckles (MPs) and hold, while focus on  straightening middle and tip joints of fingers. Repeat _15___ times. Do __6-8__ sessions per day.  .     Finger Flexion / Extension   Bend fingers of left hand toward palm, making a  fist. Straighten fingers, opening fist. Repeat sequence _10-15___ times per session. Do _6-8__ sessions per day.   MP Flexion (Active Isolated)   AROM: PIP Flexion / Extension   Pinch bottom knuckle of __small______ finger of hand to prevent bending. Actively bend middle knuckle until stretch is felt. Hold __5__ seconds. Relax. Straighten finger as far as possible. Repeat __10-15__ times per set. Do _4-6___ sessions per day.   AROM: DIP Flexion / Extension   Pinch middle knuckle of __small______ finger of  hand to prevent bending. Bend end knuckle until stretch is felt. Hold _5___ seconds. Relax. Straighten finger as far as possible. Repeat _10-15___ times per set.  Do _4-6___ sessions per day.    Copyright  VHI. All rights reserved.

## 2018-12-20 NOTE — Therapy (Signed)
[facility] [address] [place], [place], [address] Phone: [phone]   Fax:  [phone]  Occupational Therapy Evaluation  Patient Details  Name: Darrell Cannon MRN: [id number] Date of Birth: 1991/03/20 Referring Provider (OT): Dr. Janee Morn   Encounter Date: 12/20/2018  OT End of Session - 12/20/18 1046    Visit Number  1    Number of Visits  8    Date for OT Re-Evaluation  02/18/19    Authorization Type  self pay    OT Start Time  0935    OT Stop Time  1034    OT Time Calculation (min)  59 min    Activity Tolerance  Patient tolerated treatment well    Behavior During Therapy  [facility] for tasks assessed/performed       Past Medical History:  Diagnosis Date  . Finger fracture, left 12/07/2018   small finger    Past Surgical History:  Procedure Laterality Date  . APPENDECTOMY    . OPEN REDUCTION INTERNAL FIXATION (ORIF) METACARPAL Left 12/12/2018   Procedure: OPEN REDUCTION INTERNAL FIXATION (ORIF) LEFT SMALL FINGER FRACTURE;  Surgeon: Mack Hook, MD;  Location: [facility];  Service: Orthopedics;  Laterality: Left;    There were no vitals filed for this visit.  Subjective Assessment - 12/20/18 1044    Subjective   Pt reports mild finger pain    Patient Stated Goals  regain use of hand    Currently in Pain?  Yes    Pain Score  3     Pain Location  Finger (Comment which one)    Pain Orientation  Left    Pain Descriptors / Indicators  Aching    Pain Type  Surgical pain    Pain Onset  1 to 4 weeks ago    Pain Frequency  Constant    Aggravating Factors   movement    Pain Relieving Factors  rest        [facility] OT Assessment - 12/20/18 1106      Assessment   Medical Diagnosis  ORIF left P1 fx    Referring Provider (OT)  Dr. Janee Morn    Hand Dominance  Right      Precautions   Precautions  Other (comment)    Precaution Comments  splint when not exercising or performing hygiene       Home  Environment   Family/patient expects to be discharged to:  Private residence    Lives With  Spouse      Prior Function   Level of Independence  Independent    Vocation  Full time employment    Vocation Requirements  works [employer] representative, currently working, cautioned against lifting      ADL   ADL comments  modified I using primarily his Education administrator Expression   Dominant Hand  Right      Cognition   Overall Cognitive Status  Within Functional Limits for tasks assessed      Coordination   Fine Motor Movements are Fluid and Coordinated  No      Edema   Edema  Pt's incision appears closed, moderate edema at PIP joint for 5th digit      ROM / Strength   AROM / PROM / Strength  AROM;Strength      AROM   Overall AROM   Deficits;Due to pain  Strength   Overall Strength  Deficits;Due to precautions;Unable to assess      Left Hand AROM   L Ring  MCP 0-90  75 Degrees    L Ring PIP 0-100  --   WFLS   L Little  MCP 0-90  75 Degrees   grossly 75% composite finger flexion   L Little PIP 0-100  60 Degrees    L Little DIP 0-70  35 Degrees               OT Treatments/Exercises (OP) - 12/20/18 0001      Splinting   Splinting  Pt arrived wearing protective splint. Splint was very soiled. 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 Splint was removed and hand was washed with soap and water and dried thoroughly . Finger stockienette applied over small finger. Pt was fitted with a hand based ular gutter splint to provide additional protection and to keeep finger clean as pt continues to work Holiday representative.Splint with  MP's in slight flexion, IP's extended including ring and small finger. Pt/ wife were instructed in splint wear, care and precautions.  Pt'/ wife verbalized understanding. Pt was also instructed in inital A/ROM HEP.            OT Education - 12/20/18 1117    Education Details  splint wear, care and  precautions, inital A/ROM HEP, pt was cautioned against use of LUE due to risk for injury.    Person(s) Educated  Patient;Spouse    Methods  Explanation;Demonstration;Verbal cues;Handout    Comprehension  Verbalized understanding;Returned demonstration;Verbal cues required       OT Short Term Goals - 12/20/18 1047      OT SHORT TERM GOAL #1   Title  I with splint wear care and precautions.    Time  4    Period  Weeks    Status  New    Target Date  01/19/19      OT SHORT TERM GOAL #2   Title  I with inital HEP    Time  4    Period  Weeks    Status  New        OT Long Term Goals - 12/20/18 1049      OT LONG TERM GOAL #1   Title  I with updated HEP.    Time  8    Period  Weeks    Status  New    Target Date  02/18/19      OT LONG TERM GOAL #2   Title  Pt will resume use of LUE as a non dominant assist at least 75% of the time with pain less than or equal to 3/10.    Time  8    Period  Weeks    Status  New      OT LONG TERM GOAL #3   Title  Pt will demonstrate PIP flexion of at least 80* for left 5th digit for increased functional use.    Time  8    Period  Weeks    Status  New      OT LONG TERM GOAL #4   Title  Pt will demonstrate grip strength of at least 30 lbs for increased LUE functional use.    Time  8    Period  Weeks    Status  New            Plan - 12/20/18 1055    Clinical Impression Statement  Pt  is a 28 y.o s/p ORIF left small finger P1 fx on 12/12/2018 by Dr. Janee Mornhompson. Pt presents with the following deficits which impede performance of ADLS/IADLS: deceased strength, decreased ROM, decreased LUE functional use and  pain, . Pt can benefit from skilled occupational therapy to maximize pt'ssafety and independence with daily activities.    Occupational Profile and client history currently impacting functional performance  Pt was completely I with ADLS/IADLS prior to injury, pt is currently still working Holiday representativeconstruction, pt was cautioned against left hand  use. PMH: ORIF P1 fx    Occupational performance deficits (Please refer to evaluation for details):  ADL's;IADL's;Work;Play;Leisure    Rehab Potential  Good    OT Frequency  --   8 visits x 8 weeks plus eval   OT Duration  8 weeks    OT Treatment/Interventions  Self-care/ADL training;Therapeutic exercise;Moist Heat;Paraffin;Neuromuscular education;Splinting;Patient/family education;Electrical Stimulation;Therapeutic activities;Fluidtherapy;Cryotherapy;Ultrasound;DME and/or AE instruction;Manual Therapy;Passive range of motion    Plan  splint check, review A/ROM exercises, add P/ROM exercises, schedule appt in several weeks when pt will be cleared for strengthening    Clinical Decision Making  Limited treatment options, no task modification necessary    OT Home Exercise Plan  splint wear, care and precautions, HEP    Consulted and Agree with Plan of Care  Patient       Patient will benefit from skilled therapeutic intervention in order to improve the following deficits and impairments:  Increased edema, Impaired flexibility, Pain, Decreased range of motion, Decreased endurance, Decreased safety awareness, Impaired perceived functional ability, Impaired UE functional use  Visit Diagnosis: Pain in left hand - Plan: Ot plan of care cert/re-cert  Stiffness of left hand, not elsewhere classified - Plan: Ot plan of care cert/re-cert  Localized edema - Plan: Ot plan of care cert/re-cert  Muscle weakness (generalized) - Plan: Ot plan of care cert/re-cert    Problem List There are no active problems to display for this patient.   Darrell Cannon 12/20/2018, 11:26 AM Keene BreathKathryn Desma Wilkowski, OTR/L [phone] Phone: [phone] 11:26 AM 01/14/20[facility] [address] [place], [place], [address] Phone: [phone]   Fax:  [phone]  Name: Darrell Cannon MRN: [id number] Date of Birth: 03/26/1991

## 2018-12-28 ENCOUNTER — Ambulatory Visit: Payer: Self-pay | Admitting: Occupational Therapy

## 2019-11-14 IMAGING — RF DG C-ARM 61-120 MIN
1 series · 3 of 3 positions shown · non-contrast
Comparison: None.

CLINICAL DATA: Finger fracture

EXAM:
LEFT LITTLE FINGER 2+V; DG C-ARM 61-120 MIN

[Series 1: run · 3 of 3 slices shown]
[im 1/3]
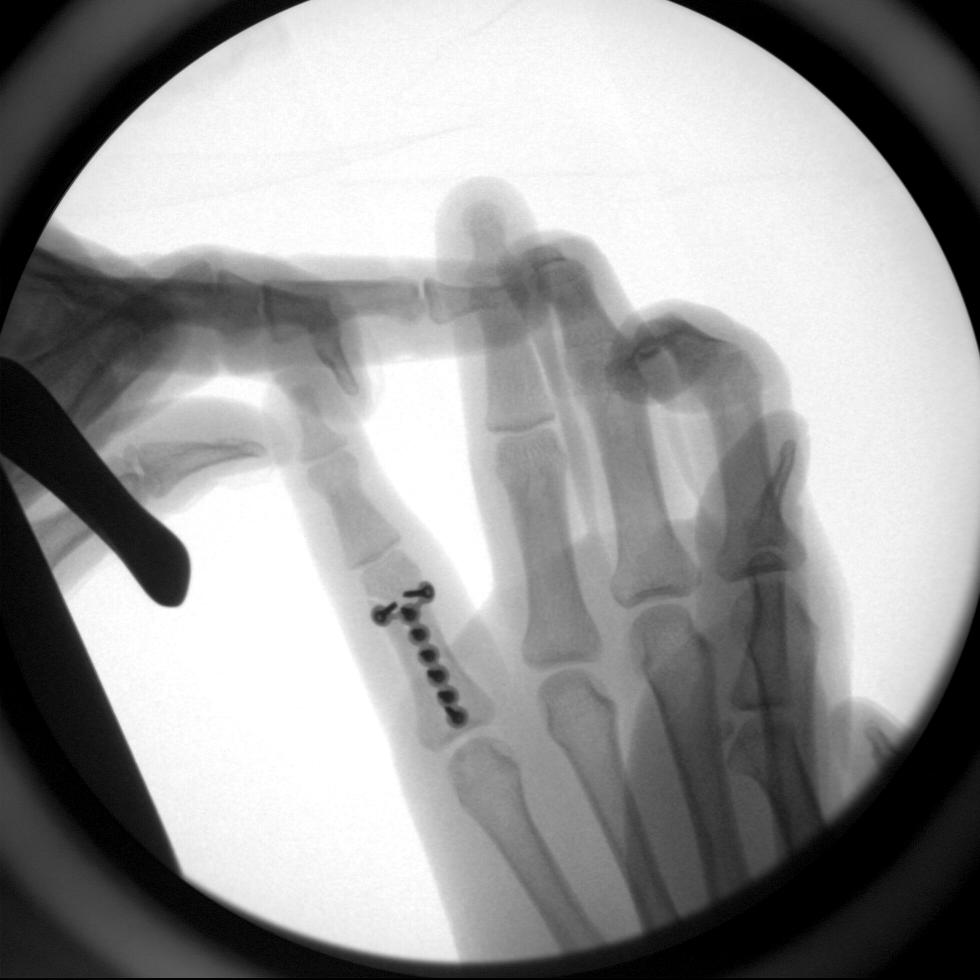
[im 2/3]
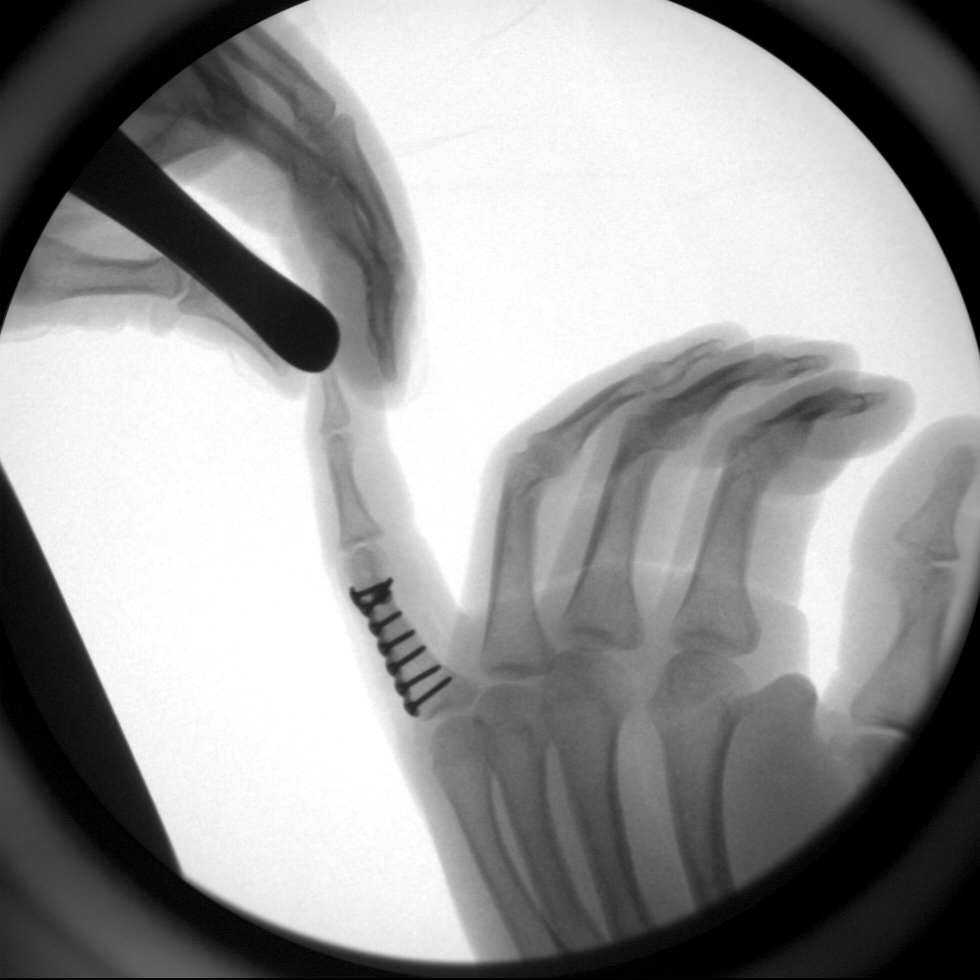
[im 3/3]
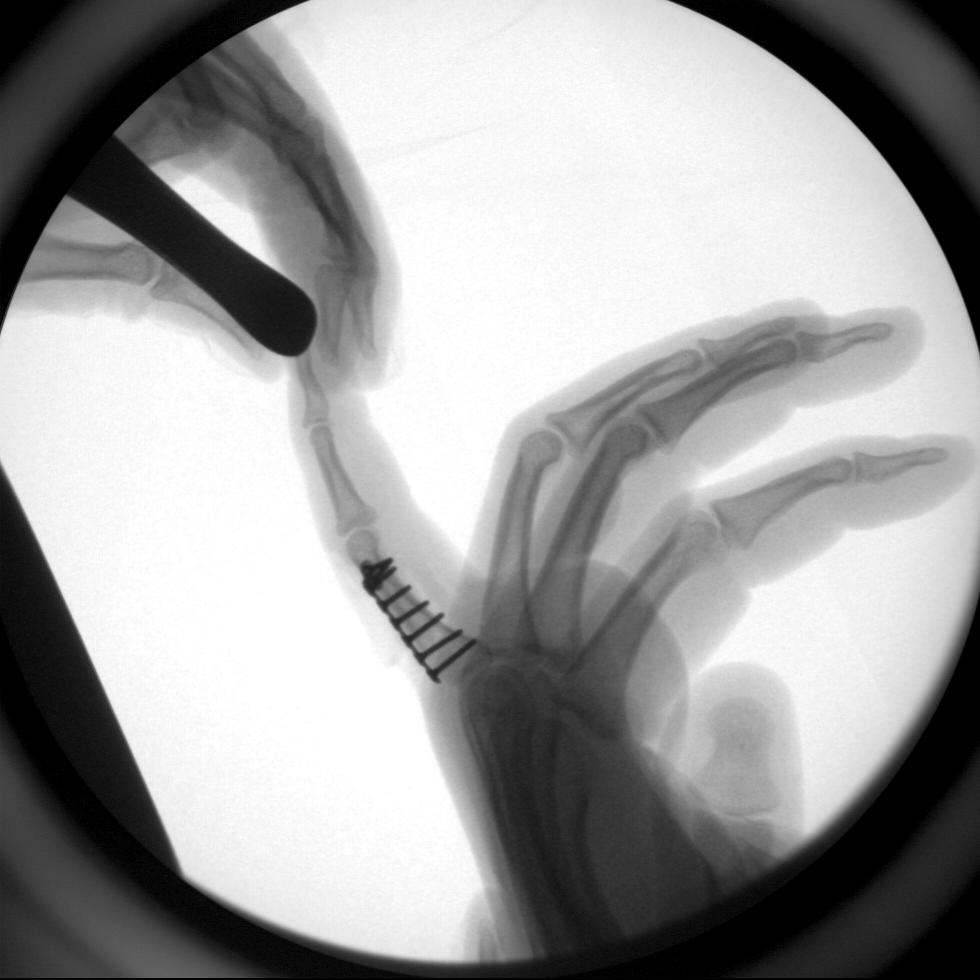

[3 of 3 positions shown; findings below may reference images not displayed]

FINDINGS: Three low resolution intraoperative spot views of the left fifth
finger. Total fluoroscopy time was 12 seconds.

The images demonstrate surgical plate and multiple screw fixation of
the fifth proximal phalanx with anatomic alignment of fracture
fragments.
IMPRESSION: Intraoperative fluoroscopic assistance provided during surgical
fixation of left fifth digit proximal phalanx fracture

## 2021-03-14 ENCOUNTER — Emergency Department
Admission: EM | Admit: 2021-03-14 | Discharge: 2021-03-14 | Disposition: A | Discharge status: Home / Self Care | Payer: Self-pay | Attending: Emergency Medicine | Admitting: Emergency Medicine

## 2021-03-14 ENCOUNTER — Encounter: Payer: Self-pay | Admitting: Emergency Medicine

## 2021-03-14 ENCOUNTER — Emergency Department: Payer: Self-pay

## 2021-03-14 ENCOUNTER — Other Ambulatory Visit: Payer: Self-pay

## 2021-03-14 DIAGNOSIS — Z23 Encounter for immunization: Secondary | ICD-10-CM | POA: Insufficient documentation

## 2021-03-14 DIAGNOSIS — W268XXA Contact with other sharp object(s), not elsewhere classified, initial encounter: Secondary | ICD-10-CM | POA: Insufficient documentation

## 2021-03-14 DIAGNOSIS — S66322A Laceration of extensor muscle, fascia and tendon of right middle finger at wrist and hand level, initial encounter: Secondary | ICD-10-CM | POA: Insufficient documentation

## 2021-03-14 DIAGNOSIS — F17210 Nicotine dependence, cigarettes, uncomplicated: Secondary | ICD-10-CM | POA: Insufficient documentation

## 2021-03-14 DIAGNOSIS — S61219A Laceration without foreign body of unspecified finger without damage to nail, initial encounter: Secondary | ICD-10-CM

## 2021-03-14 DIAGNOSIS — Y990 Civilian activity done for income or pay: Secondary | ICD-10-CM | POA: Insufficient documentation

## 2021-03-14 MED ORDER — SULFAMETHOXAZOLE-TRIMETHOPRIM 800-160 MG PO TABS: 1.0000 | ORAL_TABLET | Freq: Two times a day (BID) | ORAL | 0 refills | Status: AC

## 2021-03-14 MED ORDER — TETANUS-DIPHTH-ACELL PERTUSSIS 5-2.5-18.5 LF-MCG/0.5 IM SUSY
0.5000 mL | PREFILLED_SYRINGE | Freq: Once | INTRAMUSCULAR | Status: AC
  Administered 2021-03-14: 0.5 mL via INTRAMUSCULAR
  Filled 2021-03-14: qty 0.5

## 2021-03-14 MED ORDER — LIDOCAINE HCL (PF) 1 % IJ SOLN
5.0000 mL | Freq: Once | INTRAMUSCULAR | Status: AC
  Administered 2021-03-14: 5 mL

## 2021-03-14 NOTE — ED Notes (Signed)
See triage note  Presents with injury to right middle finger  States she was using a shovel and hit his hand

## 2021-03-14 NOTE — ED Provider Notes (Signed)
[facility] Emergency Department Provider Note ____________________________________________  Time seen: 1113  I have reviewed the triage vital signs and the nursing notes.  HISTORY  Chief Complaint  Hand Injury  HPI Darrell Cannon is a 30 y.o. male presents to the ED for evaluation of a work-related injury.  Patient presents with a laceration across the dorsum of the right middle finger after contusion against a rock.  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  He was shoveling without gloved hands, when he apparently rubbed his hand across a large recently chills at rock in the ground.  He presents with a laceration across the knuckles but with normal flexion extension range of the finger.  He denies any other injury at this time.  He is unclear of his current tetanus status.   Past Medical History:  Diagnosis Date  . Finger fracture, left 12/07/2018   small finger    There are no problems to display for this patient.   Past Surgical History:  Procedure Laterality Date  . APPENDECTOMY    . OPEN REDUCTION INTERNAL FIXATION (ORIF) METACARPAL Left 12/12/2018   Procedure: OPEN REDUCTION INTERNAL FIXATION (ORIF) LEFT SMALL FINGER FRACTURE;  Surgeon: Mack Hook, MD;  Location: [facility];  Service: Orthopedics;  Laterality: Left;    Prior to Admission medications   Medication Sig Start Date End Date Taking? Authorizing Provider  sulfamethoxazole-trimethoprim (BACTRIM DS) 800-160 MG tablet Take 1 tablet by mouth 2 (two) times daily. 03/14/21  Yes Deangleo Passage, Charlesetta Ivory, PA-C    Allergies Patient has no known allergies.  History reviewed. No pertinent family history.  Social History Social History   Tobacco Use  . Smoking status: Current Every Day Smoker    Packs/day: 0.50    Years: 10.00    Pack years: 5.00    Types: Cigarettes  . Smokeless tobacco: Never Used  Vaping Use  . Vaping Use: Never used   Substance Use Topics  . Alcohol use: Yes    Comment: occasionally  . Drug use: No    Review of Systems  Constitutional: Negative for fever. Eyes: Negative for visual changes. ENT: Negative for sore throat. Cardiovascular: Negative for chest pain. Respiratory: Negative for shortness of breath. Gastrointestinal: Negative for abdominal pain, vomiting and diarrhea. Genitourinary: Negative for dysuria. Musculoskeletal: Negative for back pain. Skin: Negative for rash.  Right middle finger laceration as above.  Neurological: Negative for headaches, focal weakness or numbness. ____________________________________________  PHYSICAL EXAM:  VITAL SIGNS: ED Triage Vitals  Enc Vitals Group     BP 03/14/21 1105 123/73     Pulse Rate 03/14/21 1105 (!) 101     Resp 03/14/21 1105 20     Temp 03/14/21 1105 97.9 F (36.6 C)     Temp Source 03/14/21 1105 Oral     SpO2 03/14/21 1105 98 %     Weight 03/14/21 1057 130 lb (59 kg)     Height 03/14/21 1057 5\' 7"  (1.702 m)     Head Circumference --      Peak Flow --      Pain Score 03/14/21 1057 5     Pain Loc --      Pain Edu? --      Excl. in GC? --     Constitutional: Alert and oriented. Well appearing and in no distress. Head: Normocephalic and atraumatic. Eyes: Conjunctivae are normal. Normal extraocular movements Cardiovascular: Normal rate, regular rhythm. Normal distal  pulses. Respiratory: Normal respiratory effort.  Musculoskeletal: Right hand without obvious deformity or dislocation.  Patient with a 4 cm laceration across the dorsum of the right middle finger.  Normal composite fist on exam.  No active bleeding is appreciated.  Nontender with normal range of motion in all extremities.  Neurologic:  Normal sensation. Normal speech and language. No gross focal neurologic deficits are appreciated. Skin:  Skin is warm, dry and intact. No rash noted. ____________________________________________   RADIOLOGY  DG Right Middle  Finger  IMPRESSION: No fracture or dislocation is noted. Possible debris or foreign body seen in soft tissues posterior to third proximal interphalangeal Joint.  I, Lissa Hoard, personally viewed and evaluated these images (plain radiographs) as part of my medical decision making, as well as reviewing the written report by the radiologist. ____________________________________________  PROCEDURES  Tdap 0.5 ml IM  .Marland KitchenLaceration Repair  Date/Time: 03/14/2021 11:28 AM Performed by: Lissa Hoard, PA-C Authorized by: Lissa Hoard, PA-C   Consent:    Consent obtained:  Verbal   Consent given by:  Patient   Risks, benefits, and alternatives were discussed: yes     Risks discussed:  Infection and need for additional repair Universal protocol:    Procedure explained and questions answered to patient or proxy's satisfaction: yes     Site/side marked: yes     Patient identity confirmed:  Verbally with patient Anesthesia:    Anesthesia method: transthecal block. Laceration details:    Location:  Finger   Finger location:  R long finger   Length (cm):  4   Depth (mm):  5 Pre-procedure details:    Preparation:  Patient was prepped and draped in usual sterile fashion Exploration:    Limited defect created (wound extended): no     Wound extent: tendon damage     Tendon damage location:  Upper extremity   Upper extremity tendon damage location:  Finger extensor   Finger extensor tendon:  Extensor digitorum   Tendon damage extent:  Partial transection   Tendon repair plan:  Refer for evaluation   Contaminated: no   Treatment:    Area cleansed with:  Povidone-iodine and saline   Amount of cleaning:  Extensive   Irrigation solution:  Sterile saline   Irrigation volume:  20   Irrigation method:  Syringe   Visualized foreign bodies/material removed: yes     Debridement:  None   Undermining:  None   Scar revision: no   Skin repair:    Repair method:   Sutures   Suture size:  4-0   Suture material:  Nylon   Suture technique:  Simple interrupted   Number of sutures:  9 Approximation:    Approximation:  Close Repair type:    Repair type:  Simple Post-procedure details:    Dressing:  Non-adherent dressing and splint for protection   Procedure completion:  Tolerated well, no immediate complications  ____________________________________________  INITIAL IMPRESSION / ASSESSMENT AND PLAN / ED COURSE  ED evaluation of accidental laceration to the right middle finger.  Patient was evaluated for his symptoms with x-ray and found to have no signs of acute fracture or dislocation.  Exploration of the wound today revealed a partial transection of the extensor tendon, but full extension of the fingers noted.  Wound repair was performed with good wound edge approximation.  Patient instructed wound care instructions and supplies.  Prophylactic course of Keflex is provided.  He will follow-up with his primary provider  or local urgent care for suture removal in 10 to 12 days.   JAVIUS SYLLA was evaluated in Emergency Department on 03/14/2021 for the symptoms described in the history of present illness. He was evaluated in the context of the global COVID-19 pandemic, which necessitated consideration that the patient might be at risk for infection with the SARS-CoV-2 virus that causes COVID-19. Institutional protocols and algorithms that pertain to the evaluation of patients at risk for COVID-19 are in a state of rapid change based on information released by regulatory bodies including the CDC and federal and state organizations. These policies and algorithms were followed during the patient's care in the ED. ____________________________________________  FINAL CLINICAL IMPRESSION(S) / ED DIAGNOSES  Final diagnoses:  Finger laceration involving tendon, initial encounter      Lissa Hoard, PA-C 03/14/21 1544    Merwyn Katos, MD 03/16/21  1334

## 2021-03-14 NOTE — Discharge Instructions (Signed)
The wound clean, dry, and covered.  Follow-up with [facility] or your PCP for suture removal in 10 to 12 days.

## 2021-03-14 NOTE — ED Triage Notes (Signed)
Pt reports was working and was shoveling under a pipe and cut his right hand middle finger. Bleeding controlled

## 2022-02-14 IMAGING — DX DG FINGER MIDDLE 2+V*R*
3 series · 3 of 3 positions shown · non-contrast
Comparison: August 06, 2008.

CLINICAL DATA: Right middle finger laceration.

EXAM:
RIGHT MIDDLE FINGER 2+V

[finger ap]
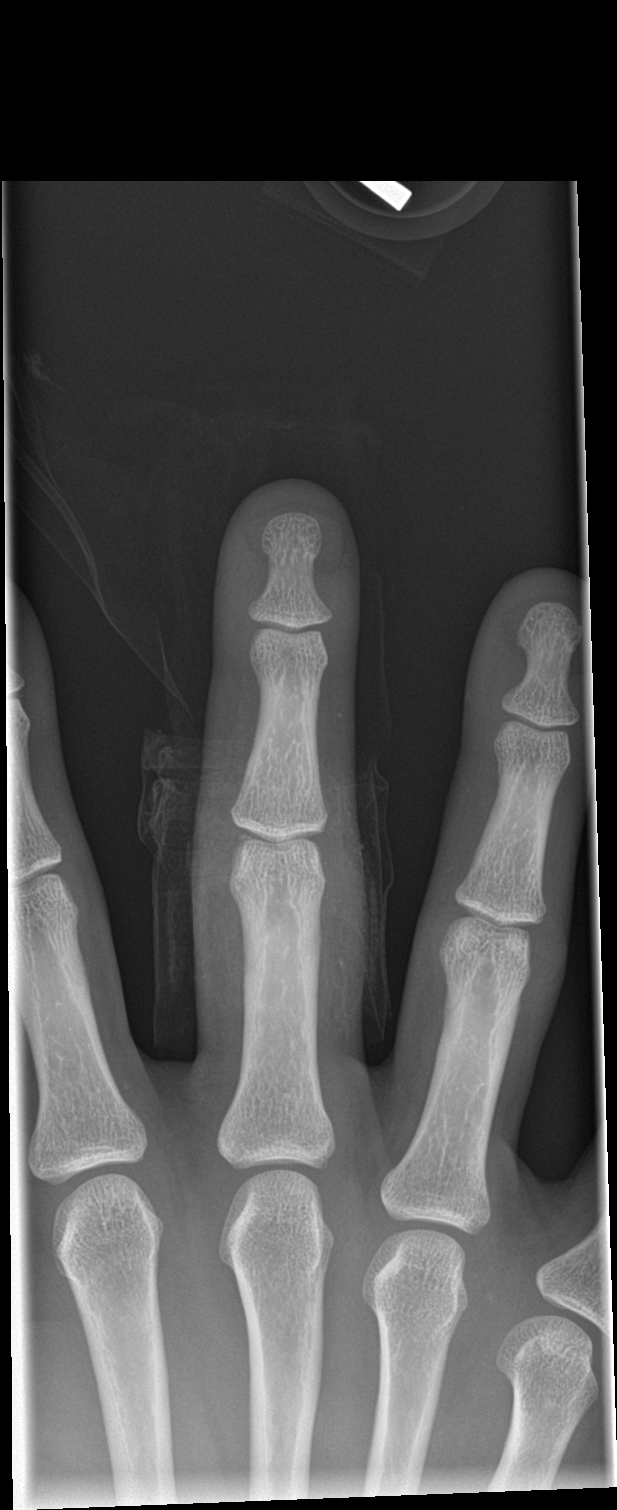

[finger obl]
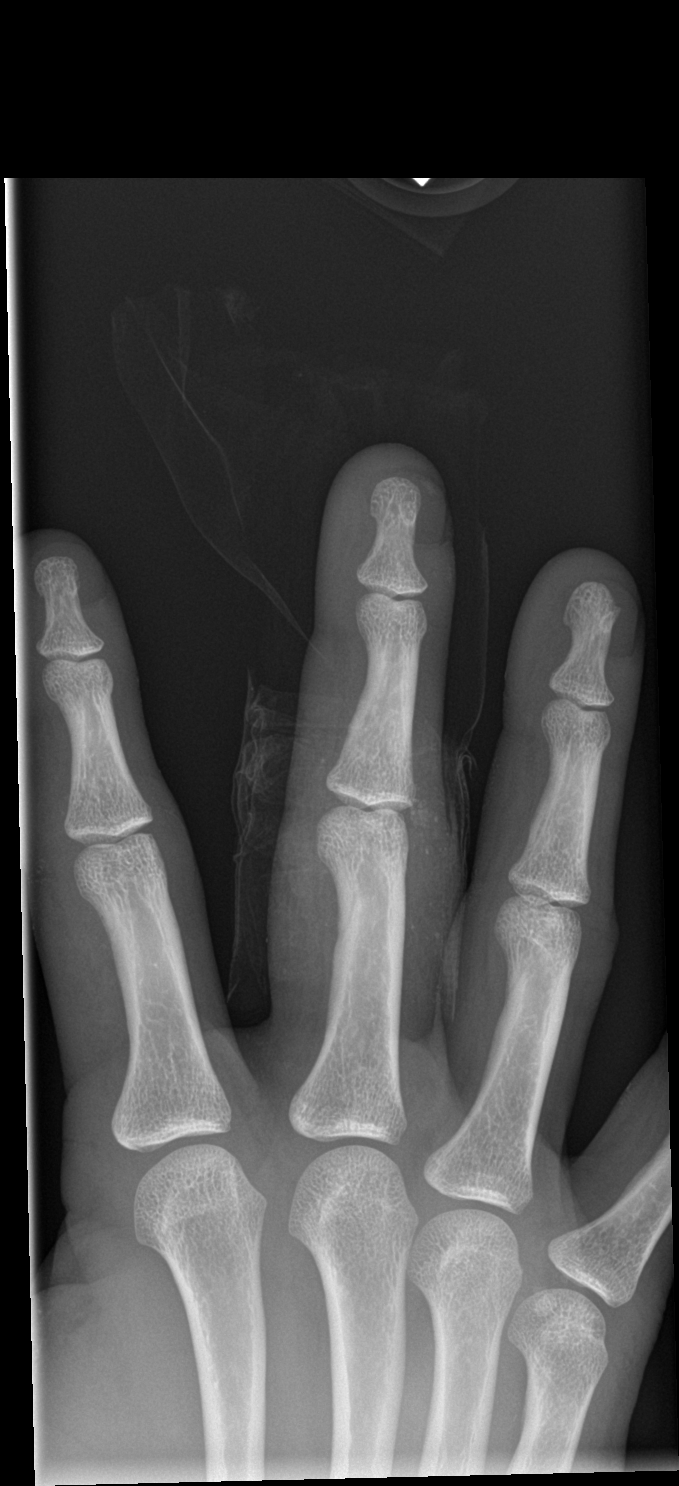

[finger lat]
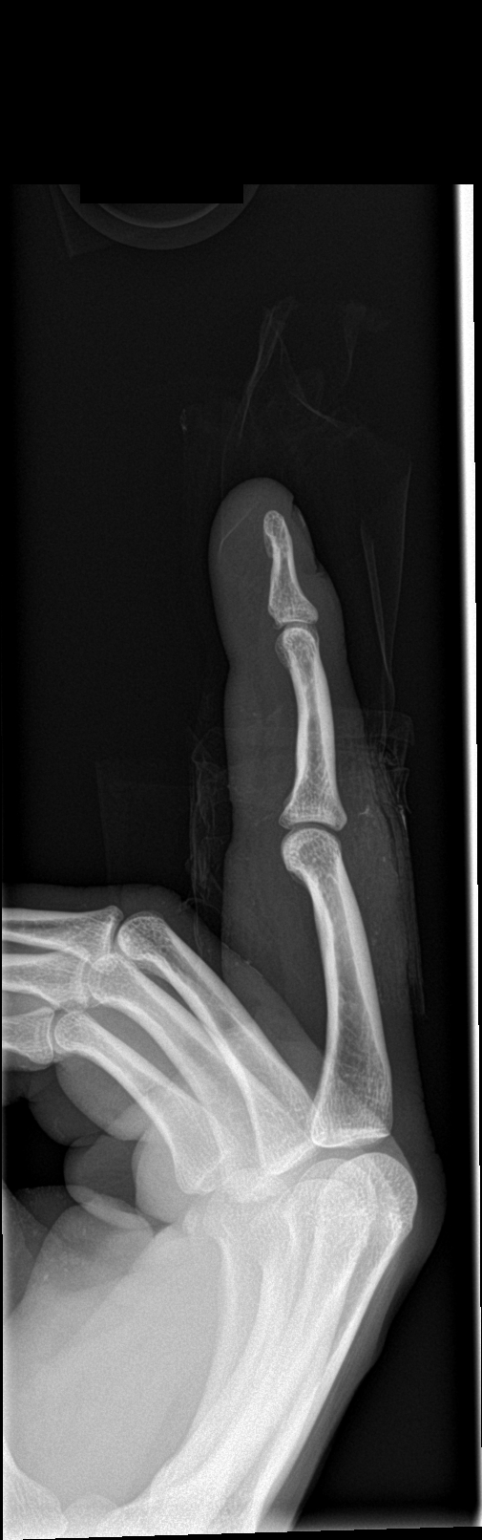

[3 of 3 positions shown; findings below may reference images not displayed]

FINDINGS: There is no evidence of fracture or dislocation. There is no
evidence of arthropathy or other focal bone abnormality. Some small
ill-defined densities are seen in the soft tissues dorsal to the
third proximal interphalangeal joint concerning for debris or small
foreign bodies.
IMPRESSION: No fracture or dislocation is noted. Possible debris or foreign body
seen in soft tissues posterior to third proximal interphalangeal
joint.
